# Patient Record
Sex: Male | Born: 1945
Health system: Southern US, Community
[De-identification: ages and names within clinical notes are randomized; demographics above are authoritative.]

## PROBLEM LIST (undated history)

## (undated) DIAGNOSIS — C801 Malignant (primary) neoplasm, unspecified: Secondary | ICD-10-CM

## (undated) DIAGNOSIS — E785 Hyperlipidemia, unspecified: Secondary | ICD-10-CM

## (undated) HISTORY — DX: Malignant (primary) neoplasm, unspecified: C80.1

## (undated) HISTORY — DX: Hyperlipidemia, unspecified: E78.5

---

## 2009-07-28 ENCOUNTER — Encounter: Admission: RE | Admit: 2009-07-28 | Discharge: 2009-07-28 | Payer: Self-pay | Admitting: Family Medicine

## 2010-04-28 ENCOUNTER — Ambulatory Visit: Admission: RE | Admit: 2010-04-28 | Discharge: 2010-07-27 | Payer: Self-pay | Admitting: Radiation Oncology

## 2010-05-25 ENCOUNTER — Encounter: Admission: RE | Admit: 2010-05-25 | Discharge: 2010-05-25 | Payer: Self-pay | Admitting: Urology

## 2010-07-13 ENCOUNTER — Ambulatory Visit (HOSPITAL_BASED_OUTPATIENT_CLINIC_OR_DEPARTMENT_OTHER): Admission: RE | Admit: 2010-07-13 | Discharge: 2010-07-13 | Payer: Self-pay | Admitting: Urology

## 2010-08-02 ENCOUNTER — Ambulatory Visit
Admission: RE | Admit: 2010-08-02 | Discharge: 2010-09-05 | Payer: Self-pay | Source: Home / Self Care | Admitting: Radiation Oncology

## 2010-12-21 LAB — CBC
HCT: 44.3 % (ref 39.0–52.0)
Hemoglobin: 15.2 g/dL (ref 13.0–17.0)
MCH: 31.6 pg (ref 26.0–34.0)
MCHC: 34.3 g/dL (ref 30.0–36.0)
MCV: 92 fL (ref 78.0–100.0)
Platelets: 160 10*3/uL (ref 150–400)
RBC: 4.82 MIL/uL (ref 4.22–5.81)
RDW: 12.9 % (ref 11.5–15.5)
WBC: 5.4 10*3/uL (ref 4.0–10.5)

## 2010-12-21 LAB — COMPREHENSIVE METABOLIC PANEL
ALT: 30 U/L (ref 0–53)
AST: 35 U/L (ref 0–37)
Albumin: 4.3 g/dL (ref 3.5–5.2)
Alkaline Phosphatase: 73 U/L (ref 39–117)
BUN: 20 mg/dL (ref 6–23)
CO2: 31 mEq/L (ref 19–32)
Calcium: 9.4 mg/dL (ref 8.4–10.5)
Chloride: 102 mEq/L (ref 96–112)
Creatinine, Ser: 1.33 mg/dL (ref 0.4–1.5)
GFR calc Af Amer: >60 mL/min (ref >60)
GFR calc non Af Amer: 54 mL/min — ABNORMAL LOW (ref >60)
Glucose, Bld: 104 mg/dL — ABNORMAL HIGH (ref 70–99)
Potassium: 4.8 mEq/L (ref 3.5–5.1)
Sodium: 139 mEq/L (ref 135–145)
Total Bilirubin: 1 mg/dL (ref 0.3–1.2)
Total Protein: 7.1 g/dL (ref 6.0–8.3)

## 2010-12-21 LAB — PROTIME-INR
INR: 1 (ref 0.00–1.49)
Prothrombin Time: 13.4 seconds (ref 11.6–15.2)

## 2010-12-21 LAB — APTT: aPTT: 32 seconds (ref 24–37)

## 2011-02-05 ENCOUNTER — Ambulatory Visit: Payer: Self-pay | Attending: Radiation Oncology | Admitting: Radiation Oncology

## 2011-09-04 ENCOUNTER — Other Ambulatory Visit: Payer: Self-pay | Admitting: Urology

## 2011-09-04 DIAGNOSIS — C61 Malignant neoplasm of prostate: Secondary | ICD-10-CM

## 2011-09-12 ENCOUNTER — Encounter (HOSPITAL_COMMUNITY)
Admission: RE | Admit: 2011-09-12 | Discharge: 2011-09-12 | Disposition: A | Discharge status: Home / Self Care | Payer: BC Managed Care – PPO | Source: Ambulatory Visit | Attending: Urology | Admitting: Urology

## 2011-09-12 DIAGNOSIS — C61 Malignant neoplasm of prostate: Secondary | ICD-10-CM | POA: Insufficient documentation

## 2011-09-12 MED ORDER — TECHNETIUM TC 99M MEDRONATE IV KIT
25.0000 | PACK | Freq: Once | INTRAVENOUS | Status: AC | PRN
  Administered 2011-09-12: 25 via INTRAVENOUS

## 2011-10-09 DIAGNOSIS — C801 Malignant (primary) neoplasm, unspecified: Secondary | ICD-10-CM

## 2011-10-09 HISTORY — DX: Malignant (primary) neoplasm, unspecified: C80.1

## 2011-11-15 ENCOUNTER — Encounter: Payer: Self-pay | Admitting: *Deleted

## 2011-11-15 NOTE — Progress Notes (Signed)
05/01/10,    I-PSS= [id number] score=9

## 2015-01-20 ENCOUNTER — Other Ambulatory Visit: Payer: Self-pay | Admitting: Family Medicine

## 2015-01-20 ENCOUNTER — Ambulatory Visit
Admission: RE | Admit: 2015-01-20 | Discharge: 2015-01-20 | Disposition: A | Discharge status: Home / Self Care | Payer: BLUE CROSS/BLUE SHIELD | Source: Ambulatory Visit | Attending: Family Medicine | Admitting: Family Medicine

## 2015-01-20 DIAGNOSIS — M5416 Radiculopathy, lumbar region: Secondary | ICD-10-CM

## 2015-02-08 ENCOUNTER — Ambulatory Visit: Payer: BLUE CROSS/BLUE SHIELD | Attending: Family Medicine

## 2015-02-08 DIAGNOSIS — M545 Low back pain: Secondary | ICD-10-CM | POA: Diagnosis present

## 2015-02-08 DIAGNOSIS — M25659 Stiffness of unspecified hip, not elsewhere classified: Secondary | ICD-10-CM | POA: Insufficient documentation

## 2015-02-08 DIAGNOSIS — M5442 Lumbago with sciatica, left side: Secondary | ICD-10-CM

## 2015-02-08 DIAGNOSIS — M5410 Radiculopathy, site unspecified: Secondary | ICD-10-CM | POA: Insufficient documentation

## 2015-02-08 NOTE — Patient Instructions (Signed)
On Elbows (Prone)  Rise up on elbows as high as possible, keeping hips on floor. Hold 1-5 minutes  Do _3-5___ sessions per day.  Press-Up  Press upper body upward, keeping hips in contact with floor. Keep lower back and buttocks relaxed. Hold _3-5___ seconds. Repeat ____ times per set. Do ____ sessions per day.                     Backward Bend (Standing)   Arch backward to make hollow of back deeper. Hold _5___ seconds. Repeat __10__ times per set. Do ____ sessions per day. Copyright  V[employer]. All rights reserved.    If you have back pain and the pain travels to your hips and/or legs, please STOP the exercises  If you already have leg pain and the leg pain gets worse, STOP and let your therapist know at your next visit  Occasionally, when you abolish leg pain, you may have a temporary increase in low back pain.  This can be a normal reaction and the back pain should eventually get better.  However, if the pain is too significant to exercise, please STOP the exercises and let your therapist know at your next visit.

## 2015-02-08 NOTE — Therapy (Signed)
[facility] [address] [place], [place], [address] Phone: [phone]   Fax:  [phone]  Physical Therapy Evaluation  Patient Details  Name: Jacob Golden MRN: [id number] Date of Birth: 17-Jun-1946 Referring Provider:  Jonathon Jordan, MD  Encounter Date: 02/08/2015      PT End of Session - 02/08/15 1311    Visit Number 1   Date for PT Re-Evaluation 04/05/15   PT Start Time 5885   PT Stop Time 1311   PT Time Calculation (min) 40 min   Activity Tolerance Patient tolerated treatment well   Behavior During Therapy [facility] for tasks assessed/performed      Past Medical History  Diagnosis Date  . Cancer 2013    prostate cancer  . Hyperlipidemia     History reviewed. No pertinent past surgical history.  There were no vitals filed for this visit.  Visit Diagnosis:  Left-sided low back pain with left-sided sciatica - Plan: PT plan of care cert/re-cert  Hip stiffness, unspecified laterality - Plan: PT plan of care cert/re-cert      Subjective Assessment - 02/08/15 1239    Subjective Pt reports to PT with complaints of Lt LE pain that began 6 weeks ago when he bent down to pick up something in the garden.     Pertinent History prostate cancer   Limitations Walking   How long can you sit comfortably? pain after sitting   How long can you walk comfortably? pt needs to walk very slowly to allow for 1 hour of walking   Diagnostic tests x-ray: degenerative at L3,4,5 and OA in Lt hip.   Patient Stated Goals reduce pain, walk at normal speed, return to exercise   Currently in Pain? Yes   Pain Score 7   2-8/10 range   Pain Location Leg   Pain Orientation Left   Pain Descriptors / Indicators Numbness;Sharp;Dull   Pain Type Chronic pain   Pain Frequency Constant   Aggravating Factors  turning quickly, stepping up, standing up after sitting long periods, walking   Pain Relieving Factors slow down when walking   Multiple Pain Sites No            [facility] PT Assessment - 02/08/15 0001    Assessment   Medical Diagnosis Low back pain (M54.5), radiculopathy (M54.10)   Onset Date 01/02/15   Next MD Visit none   Prior Therapy none   Precautions   Precautions Other (comment)  No [place]- history of cancer   Restrictions   Weight Bearing Restrictions No   Balance Screen   Has the patient fallen in the past 6 months No   Has the patient had a decrease in activity level because of a fear of falling?  No   Is the patient reluctant to leave their home because of a fear of falling?  No   Home Environment   Living Enviornment Private residence   Home Access Level entry   [place] One level   Prior Function   Level of Independence Independent with basic ADLs   Vocation Full time employment   Vocation Requirements computer work   Leisure biking, running,   Cognition   Overall Cognitive Status Within Functional Limits for tasks assessed   Observation/Other Assessments   Focus on Therapeutic Outcomes (FOTO)  51% limitation   Posture/Postural Control   Posture/Postural Control Postural limitations   Postural Limitations Weight shift right   ROM / Strength   AROM / PROM /  Strength AROM;PROM;Strength   AROM   Overall AROM  Deficits   Overall AROM Comments Lumbar AROM limited by 25% in bilateral sidebending, full flexion with Lt LE pain, extension limited by 50% with relief of Lt LE symptoms.   PROM   Overall PROM  Deficits   Overall PROM Comments Rt hip flexibility is limited by 50% in all directions, Lt hip flexibility limited by 30-40% in all directions.     Strength   Overall Strength Within functional limits for tasks performed   Overall Strength Comments 4+/5 to 5/5 bilateral LE strength thrgoughout   Palpation   Palpation Pt with significant limitaiton in Rt and Lt hip joint mobility.  Reduced PA mobility in the lumbar and thoracic spine.  No significant palpable tenderness in the lumbar and  thoracic paraspinals.     Special Tests    Special Tests Lumbar   Slump test   Findings Positive   Side Left   Straight Leg Raise   Findings Positive   Side  Left   Ambulation/Gait   Ambulation/Gait Yes   Ambulation/Gait Assistance 7: Independent   Gait velocity reduced gait velocity                           PT Education - 02/08/15 1303    Education provided Yes   Education Details HEP: Nature conservation officer) Educated Patient   Methods Explanation;Handout;Demonstration   Comprehension Verbalized understanding          PT Short Term Goals - 02/08/15 1314    PT SHORT TERM GOAL #1   Title be independent in intial HEP   Time 4   Period Weeks   Status New   PT SHORT TERM GOAL #2   Title report a 25% reduction in Lt LE radiculopathy    Time 4   Period Weeks   Status New   PT SHORT TERM GOAL #3   Title sit with Rt=Lt weightbearing at least 50% of the time when sitting at work   Time 4   Period Weeks   Status New           PT Long Term Goals - 02/08/15 1235    PT [place] #1   Title be independent in advanced HEP   Time 8   Period Weeks   Status New   PT LONG TERM GOAL #2   Title reduce FOTO to < or 35% limitation   Time 8   Period Weeks   Status New   PT LONG TERM GOAL #3   Title report a 50% reduction in Lt LE radiculopathy   Time 8   Period Weeks   Status New   PT LONG TERM GOAL #4   Title return to cycling for exercise without limitation   Time 8   Period Weeks   Status New   PT LONG TERM GOAL #5   Title sit with Rt=Lt weightbearing at least 75% of the time at work   Time [place] - 02/08/15 1311    Clinical Impression Statement Pt presents to PT with Lt LE radiculopathy that began ~6 weeks ago when he bent over in his garden.  Pt presents with positive SLR and slump test on the Lt and significant hip flexibility limitation.  Pt will benefit from PT  to improve  body mechanics, flexibility and traction to reduce radiculopathy.    Pt will benefit from skilled therapeutic intervention in order to improve on the following deficits Improper body mechanics;Impaired flexibility;Decreased activity tolerance;Pain   Rehab Potential Good   PT Frequency 2x / week   PT Duration 8 weeks   PT Treatment/Interventions ADLs/Self Care Home Management;Moist Heat;Therapeutic activities;Patient/family education;Therapeutic exercise;Traction;Passive range of motion;Manual techniques;Cryotherapy;Neuromuscular re-education;Electrical Stimulation   PT Next Visit Plan Lumbar traction, body mechanics education, hip/lumbar mobs, modalities PRN   Consulted and Agree with Plan of Care Patient         Problem List There are no active problems to display for this patient.   TAKACS,KELLY, PT 02/08/2015, 1:19 PM  [facility] [facility] [address] [place], [place], [address] Phone: [phone]   Fax:  [phone]

## 2015-02-09 ENCOUNTER — Ambulatory Visit: Payer: BLUE CROSS/BLUE SHIELD

## 2015-02-15 ENCOUNTER — Ambulatory Visit: Payer: BLUE CROSS/BLUE SHIELD | Admitting: Physical Therapy

## 2015-02-15 ENCOUNTER — Encounter: Payer: Self-pay | Admitting: Physical Therapy

## 2015-02-15 DIAGNOSIS — M25659 Stiffness of unspecified hip, not elsewhere classified: Secondary | ICD-10-CM

## 2015-02-15 DIAGNOSIS — M5442 Lumbago with sciatica, left side: Secondary | ICD-10-CM

## 2015-02-15 DIAGNOSIS — M545 Low back pain: Secondary | ICD-10-CM | POA: Diagnosis not present

## 2015-02-15 NOTE — Therapy (Signed)
[facility] [address], [place], [place], [address] Phone: [phone]   Fax:  [phone]  Physical Therapy Treatment  Patient Details  Name: Jacob Golden MRN: [id number] Date of Birth: 1946-09-18 Referring Provider:  Jonathon Jordan, MD  Encounter Date: 02/15/2015      PT End of Session - 02/15/15 1656    Visit Number 2   Date for PT Re-Evaluation 04/05/15   PT Start Time 3094   PT Stop Time 1702   PT Time Calculation (min) 46 min   Activity Tolerance Patient tolerated treatment well   Behavior During Therapy [facility] for tasks assessed/performed      Past Medical History  Diagnosis Date  . Cancer 2013    prostate cancer  . Hyperlipidemia     History reviewed. No pertinent past surgical history.  There were no vitals filed for this visit.  Visit Diagnosis:  Left-sided low back pain with left-sided sciatica  Hip stiffness, unspecified laterality                       [facility] Adult PT Treatment/Exercise - 02/15/15 0001    Exercises   Exercises Lumbar   Lumbar Exercises: Stretches   Standing Extension Other (comment)  McKenzie extension 2 x 10 reps    Prone on Elbows Stretch 1 rep  2 minutes   Press Ups Other (comment)  McKenzie extension in prone x 10 pt limited in Rom   Lumbar Exercises: Aerobic   Stationary Bike L2 x 23min   Modalities   Modalities Traction   Traction   Type of Traction Lumbar   Min (lbs) 55   Max (lbs) 75   Hold Time 60   Rest Time 10   Time 15                PT Education - 02/15/15 1628    Education provided Yes   Education Details Bodymechanics: lifting squat down, getting in and out of bed, golferlift, sitting with towel roll in back    Person(s) Educated Patient   Methods Explanation;Demonstration;Handout   Comprehension Verbalized understanding;Returned demonstration          PT Short Term Goals - 02/15/15 1700    PT SHORT TERM GOAL #1    Title be independent in intial HEP   Time 4   Period Weeks   Status On-going   PT SHORT TERM GOAL #2   Title report a 25% reduction in Lt LE radiculopathy    Time 4   Period Weeks   Status On-going   PT SHORT TERM GOAL #3   Title sit with Rt=Lt weightbearing at least 50% of the time when sitting at work   Time 4   Period Weeks   Status On-going           PT Long Term Goals - 02/15/15 1701    PT LONG TERM GOAL #1   Title be independent in advanced HEP   Time 8   Period Weeks   Status On-going   PT LONG TERM GOAL #2   Title reduce FOTO to < or 35% limitation   Time 8   Period Weeks   Status On-going   PT LONG TERM GOAL #3   Title report a 50% reduction in Lt LE radiculopathy   Time 8   Period Weeks   Status On-going   PT LONG TERM GOAL #4   Title return to cycling for exercise  without limitation   Time 8   Period Weeks   Status On-going   PT LONG TERM GOAL #5   Title sit with Rt=Lt weightbearing at least 75% of the time at work   Time 8   Period Weeks   Status On-going               Plan - 02/15/15 1656    Clinical Impression Statement Pt reports Lt LE radiculopathy intermittend in nature started 6 weeks ago when he bent over in the garden. Pt tested positive with SLR and slump test on the Lt and significant hip flexibility limitation.    Pt will benefit from skilled therapeutic intervention in order to improve on the following deficits Improper body mechanics;Impaired flexibility;Decreased activity tolerance;Pain   Rehab Potential Good   PT Frequency 2x / week   PT Duration 8 weeks   PT Next Visit Plan Lumbar traction, body mechanics education, hip/lumbar mobs, modalities PRN   Consulted and Agree with Plan of Care Patient        Problem List There are no active problems to display for this patient.   NAUMANN-HOUEGNIFIO,Vincie Linn PTA 02/15/2015, 5:03 PM   [facility] [address] [place], [place], [address] Phone: [phone]   Fax:  [phone]

## 2015-02-15 NOTE — Patient Instructions (Signed)
   Lifting Principles  .Maintain proper posture and head alignment. .Slide object as close as possible before lifting. .Move obstacles out of the way. .Test before lifting; ask for help if too heavy. .Tighten stomach muscles without holding breath. .Use smooth movements; do not jerk. .Use legs to do the work, and pivot with feet. .Distribute the work load symmetrically and close to the center of trunk. .Push instead of pull whenever possible.   Squat down and hold basket close to stand. Use leg muscles to do the work.    Avoid twisting or bending back. Pivot around using foot movements, and bend at knees if needed when reaching for articles.        Getting Into / Out of Bed   Lower self to lie down on one side by raising legs and lowering head at the same time. Use arms to assist moving without twisting. Bend both knees to roll onto back if desired. To sit up, start from lying on side, and use same move-ments in reverse. Keep trunk aligned with legs.    Shift weight from front foot to back foot as item is lifted off shelf.    When leaning forward to pick object up from floor, extend one leg out behind. Keep back straight. Hold onto a sturdy support with other hand.      Sit upright, head facing forward. Try using a roll to support lower back. Keep shoulders relaxed, and avoid rounded back. Keep hips level with knees. Avoid crossing legs for long periods.     

## 2015-02-16 ENCOUNTER — Ambulatory Visit: Payer: BLUE CROSS/BLUE SHIELD | Admitting: Physical Therapy

## 2015-02-23 ENCOUNTER — Encounter: Payer: Self-pay | Admitting: Physical Therapy

## 2015-02-23 ENCOUNTER — Ambulatory Visit: Payer: BLUE CROSS/BLUE SHIELD | Admitting: Physical Therapy

## 2015-02-23 DIAGNOSIS — M545 Low back pain: Secondary | ICD-10-CM | POA: Diagnosis not present

## 2015-02-23 DIAGNOSIS — M25659 Stiffness of unspecified hip, not elsewhere classified: Secondary | ICD-10-CM

## 2015-02-23 DIAGNOSIS — M5442 Lumbago with sciatica, left side: Secondary | ICD-10-CM

## 2015-02-23 NOTE — Therapy (Signed)
[facility] [address], [place], [place], [address] Phone: [phone]   Fax:  [phone]  Physical Therapy Treatment  Patient Details  Name: Jacob Golden MRN: [id number] Date of Birth: November 21, 1945 Referring Provider:  Jonathon Jordan, MD  Encounter Date: 02/23/2015      PT End of Session - 02/23/15 0905    Visit Number 3   Date for PT Re-Evaluation 04/05/15   PT Start Time 0846   PT Stop Time 0935   PT Time Calculation (min) 49 min   Behavior During Therapy [facility] for tasks assessed/performed      Past Medical History  Diagnosis Date  . Cancer 2013    prostate cancer  . Hyperlipidemia     History reviewed. No pertinent past surgical history.  There were no vitals filed for this visit.  Visit Diagnosis:  Left-sided low back pain with left-sided sciatica  Hip stiffness, unspecified laterality      Subjective Assessment - 02/23/15 0855    Subjective I take things easy then it is good, but with bending or walking fast pain in low back and left LE   Limitations Walking   Currently in Pain? No/denies  Pt reports short sharp pain with bending or transition sit to stand   Pain Score 3    Pain Location Leg   Pain Orientation Left   Pain Descriptors / Indicators Sharp   Pain Type Chronic pain   Pain Frequency Constant   Multiple Pain Sites No                         [facility] Adult PT Treatment/Exercise - 02/23/15 0001    Lumbar Exercises: Stretches   Standing Extension Other (comment)  McKenzie extension, ant knees braced agains mat table    Prone on Elbows Stretch 1 rep  x85min   Press Ups Other (comment)  pt with improved ROM today   Lumbar Exercises: Aerobic   Stationary Bike L2 x 7 min   Lumbar Exercises: Prone   Other Prone Lumbar Exercises B UE at side lifting up chest and bil UE  x10 with 5 sec hold, challenging for pt.   Modalities   Modalities Traction   Traction   Min  (lbs) 55   Max (lbs) 75   Hold Time 60   Rest Time 10   Time 15                PT Education - 02/23/15 0926    Education provided Yes   Education Details strengthening in prone   Person(s) Educated Patient   Methods Explanation;Demonstration;Handout   Comprehension Verbalized understanding;Returned demonstration          PT Short Term Goals - 02/23/15 0912    PT SHORT TERM GOAL #1   Title be independent in intial HEP   Time 4   Period Weeks   Status On-going   PT SHORT TERM GOAL #2   Title report a 25% reduction in Lt LE radiculopathy   reports 15% improvement   Time 4   Period Weeks   Status On-going   PT SHORT TERM GOAL #3   Title sit with Rt=Lt weightbearing at least 50% of the time when sitting at work   Time 4   Period Weeks   Status On-going           PT Long Term Goals - 02/23/15 0912    PT LONG TERM GOAL #  1   Title be independent in advanced HEP   Time 8   Period Weeks   Status On-going   PT LONG TERM GOAL #2   Title reduce FOTO to < or 35% limitation   Time 8   Period Weeks   Status On-going   PT LONG TERM GOAL #3   Title report a 50% reduction in Lt LE radiculopathy  as of 02/23/15 reports 15% improvement   Time 8   Period Weeks   Status On-going   PT LONG TERM GOAL #4   Title return to cycling for exercise without limitation   Time 8   Period Weeks   Status On-going   PT LONG TERM GOAL #5   Title sit with Rt=Lt weightbearing at least 75% of the time at work   Time 8   Period Weeks   Status On-going               Plan - 02/23/15 0908    Clinical Impression Statement Pt appears with improved ROM lumbar spine, able to tolerate activities well in PT, he will continue to benefit from PT   Pt will benefit from skilled therapeutic intervention in order to improve on the following deficits Improper body mechanics;Impaired flexibility;Decreased activity tolerance;Pain   Rehab Potential Good   PT Frequency 2x / week   PT  Duration 8 weeks   PT Treatment/Interventions ADLs/Self Care Home Management;Moist Heat;Therapeutic activities;Patient/family education;Therapeutic exercise;Traction;Passive range of motion;Manual techniques;Cryotherapy;Neuromuscular re-education;Electrical Stimulation   PT Next Visit Plan Continue with traction, back strength, may bodybladde, modalities PRN   Consulted and Agree with Plan of Care Patient        Problem List There are no active problems to display for this patient.   NAUMANN-HOUEGNIFIO,Dravon Nott PTA 02/23/2015, 9:30 AM  [place] [facility] [address] [place], [place], [address] Phone: [phone]   Fax:  [phone]

## 2015-02-23 NOTE — Patient Instructions (Signed)
back

## 2015-02-25 ENCOUNTER — Ambulatory Visit: Payer: BLUE CROSS/BLUE SHIELD | Admitting: Physical Therapy

## 2015-02-25 ENCOUNTER — Encounter: Payer: Self-pay | Admitting: Physical Therapy

## 2015-02-25 DIAGNOSIS — M545 Low back pain: Secondary | ICD-10-CM | POA: Diagnosis not present

## 2015-02-25 DIAGNOSIS — M25659 Stiffness of unspecified hip, not elsewhere classified: Secondary | ICD-10-CM

## 2015-02-25 DIAGNOSIS — M5442 Lumbago with sciatica, left side: Secondary | ICD-10-CM

## 2015-02-25 NOTE — Therapy (Signed)
[facility] [address] [place], [place], [address] Phone: [phone]   Fax:  [phone]  Physical Therapy Treatment  Patient Details  Name: Jacob Golden MRN: [id number] Date of Birth: 11/18/1945 Referring Provider:  Jonathon Jordan, MD  Encounter Date: 02/25/2015      PT End of Session - 02/25/15 1012    Visit Number 4   Date for PT Re-Evaluation 04/05/15   PT Start Time 0932   PT Stop Time 1020   PT Time Calculation (min) 48 min   Activity Tolerance Patient tolerated treatment well   Behavior During Therapy [facility] for tasks assessed/performed      Past Medical History  Diagnosis Date  . Cancer 2013    prostate cancer  . Hyperlipidemia     History reviewed. No pertinent past surgical history.  There were no vitals filed for this visit.  Visit Diagnosis:  Left-sided low back pain with left-sided sciatica  Hip stiffness, unspecified laterality      Subjective Assessment - 02/25/15 0949    Subjective I take things easy then it is good, but with bending or walking fast pain in low back and left LE   Currently in Pain? No/denies   Multiple Pain Sites No                         [facility] Adult PT Treatment/Exercise - 02/25/15 0001    Lumbar Exercises: Stretches   Standing Extension Other (comment)  McKenzie extension, bil. ant knees braced on mat table   Prone on Elbows Stretch 1 rep  2 min   Press Ups Other (comment)  2 x 10 after Manual therapy improved ROM   Lumbar Exercises: Aerobic   Stationary Bike L2 x 8 min   Traction   Type of Traction Lumbar   Min (lbs) 55   Max (lbs) 80   Hold Time 60   Rest Time 10   Time 15   Manual Therapy   Manual Therapy Joint mobilization;Soft tissue mobilization  left hip into extension, with quadriceps stretch   Soft tissue mobilization STW with quadricceps stretch x 5 Lt LE, STW to Lt qluteal area                  PT Short Term  Goals - 02/23/15 0912    PT SHORT TERM GOAL #1   Title be independent in intial HEP   Time 4   Period Weeks   Status On-going   PT SHORT TERM GOAL #2   Title report a 25% reduction in Lt LE radiculopathy   reports 15% improvement   Time 4   Period Weeks   Status On-going   PT SHORT TERM GOAL #3   Title sit with Rt=Lt weightbearing at least 50% of the time when sitting at work   Time 4   Period Weeks   Status On-going           PT Long Term Goals - 02/23/15 0912    PT LONG TERM GOAL #1   Title be independent in advanced HEP   Time 8   Period Weeks   Status On-going   PT LONG TERM GOAL #2   Title reduce FOTO to < or 35% limitation   Time 8   Period Weeks   Status On-going   PT LONG TERM GOAL #3   Title report a 50% reduction in Lt LE radiculopathy  as of 02/23/15  reports 15% improvement   Time 8   Period Weeks   Status On-going   PT LONG TERM GOAL #4   Title return to cycling for exercise without limitation   Time 8   Period Weeks   Status On-going   PT LONG TERM GOAL #5   Title sit with Rt=Lt weightbearing at least 75% of the time at work   Time 8   Period Weeks   Status On-going               Plan - 02/25/15 1013    Clinical Impression Statement Pt will continue from skilled PT to help return to normal activities and able to walk/ run fast   Pt will benefit from skilled therapeutic intervention in order to improve on the following deficits Improper body mechanics;Impaired flexibility;Decreased activity tolerance;Pain   Rehab Potential Good   PT Frequency 2x / week   PT Duration 8 weeks   PT Next Visit Plan Continue with traction, back strength, may bodybladde, modalities PRN   Consulted and Agree with Plan of Care Patient        Problem List There are no active problems to display for this patient.   NAUMANN-HOUEGNIFIO,Navayah Sok PTA 02/25/2015, 10:15 AM  [facility] [facility] [address], [place], [place], [address] Phone: [phone]   Fax:  [phone]

## 2015-03-02 ENCOUNTER — Encounter: Payer: Self-pay | Admitting: Physical Therapy

## 2015-03-02 ENCOUNTER — Ambulatory Visit: Payer: BLUE CROSS/BLUE SHIELD | Admitting: Physical Therapy

## 2015-03-02 DIAGNOSIS — M545 Low back pain: Secondary | ICD-10-CM | POA: Diagnosis not present

## 2015-03-02 DIAGNOSIS — M5442 Lumbago with sciatica, left side: Secondary | ICD-10-CM

## 2015-03-02 DIAGNOSIS — M25659 Stiffness of unspecified hip, not elsewhere classified: Secondary | ICD-10-CM

## 2015-03-02 NOTE — Therapy (Signed)
[facility] [address] [place], [place], [address] Phone: [phone]   Fax:  [phone]  Physical Therapy Treatment  Patient Details  Name: Jacob Golden MRN: [id number] Date of Birth: [birth year] Referring Provider:  Jonathon Jordan, MD  Encounter Date: 03/02/2015      PT End of Session - 03/02/15 0851    Visit Number 5   Date for PT Re-Evaluation 04/05/15   PT Start Time 0844   PT Stop Time 0940   PT Time Calculation (min) 56 min   Activity Tolerance Patient tolerated treatment well   Behavior During Therapy [facility] for tasks assessed/performed      Past Medical History  Diagnosis Date  . Cancer 2013    prostate cancer  . Hyperlipidemia     History reviewed. No pertinent past surgical history.  There were no vitals filed for this visit.  Visit Diagnosis:  Left-sided low back pain with left-sided sciatica  Hip stiffness, unspecified laterality      Subjective Assessment - 03/02/15 0848    Subjective Walking fast or bending causes increase of back pain, taking things easy to prevent    Currently in Pain? Yes   Pain Score 1   with bending pain up to 3-4/10, short duration   Pain Location Leg   Pain Orientation Left   Pain Descriptors / Indicators Sharp   Pain Type Chronic pain   Pain Frequency Constant   Multiple Pain Sites No                         [facility] Adult PT Treatment/Exercise - 03/02/15 0001    Lumbar Exercises: Stretches   Standing Extension Other (comment)  McKenzie extension, pt advised to     Prone on Elbows Stretch 1 rep  57min   Press Ups Other (comment)  2x10   Piriformis Stretch 20 seconds;3 reps  each side using towel   Piriformis Stretch Limitations left side more    Lumbar Exercises: Aerobic   Stationary Bike L2 x 8 min   Lumbar Exercises: Supine   Other Supine Lumbar Exercises --   Modalities   Modalities Electrical Stimulation;Moist Heat   Moist  Heat Therapy   Number Minutes Moist Heat 15 Minutes   Moist Heat Location Lumbar Spine   Electrical Stimulation   Electrical Stimulation Location Lumbar   Electrical Stimulation Action IFC   Electrical Stimulation Parameters 80-50Hz    Electrical Stimulation Goals Pain   Manual Therapy   Manual Therapy Joint mobilization;Soft tissue mobilization  left hip into extension and ER/IR   Soft tissue mobilization STW with quadricceps stretch x 5 Lt LE, STW to Lt qluteal area                PT Education - 03/02/15 1310    Education provided Yes   Education Details Figure 4 in supine using towel   Person(s) Educated Patient   Methods Explanation;Demonstration;Handout   Comprehension Verbalized understanding;Returned demonstration          PT Short Term Goals - 03/02/15 0858    PT SHORT TERM GOAL #1   Title be independent in intial HEP   Time 4   Period Weeks   Status Achieved   PT SHORT TERM GOAL #2   Title report a 25% reduction in Lt LE radiculopathy   25 -30% improved   Time 4   Period Weeks   Status Achieved   PT SHORT TERM GOAL #  3   Title sit with Rt=Lt weightbearing at least 50% of the time when sitting at work   Time 4   Period Weeks   Status On-going           PT Long Term Goals - 03/02/15 1303    PT LONG TERM GOAL #1   Title be independent in advanced HEP   Time 8   Period Weeks   Status On-going   PT LONG TERM GOAL #2   Title reduce FOTO to < or 35% limitation   Time 8   Period Weeks   Status On-going   PT LONG TERM GOAL #3   Title report a 50% reduction in Lt LE radiculopathy   Time 8   Period Weeks   Status On-going   PT LONG TERM GOAL #4   Title return to cycling for exercise without limitation   Time 8   Period Weeks   Status On-going   PT LONG TERM GOAL #5   Title sit with Rt=Lt weightbearing at least 75% of the time at work   Time 8   Period Weeks   Status On-going               Plan - 03/02/15 0851    Clinical  Impression Statement Pt will continue to improve from skilled PT to return to normal activities and able to walk fast   Pt will benefit from skilled therapeutic intervention in order to improve on the following deficits Improper body mechanics;Impaired flexibility;Decreased activity tolerance;Pain   Rehab Potential Good   PT Frequency 2x / week   PT Duration 8 weeks   PT Treatment/Interventions ADLs/Self Care Home Management;Moist Heat;Therapeutic activities;Patient/family education;Therapeutic exercise;Traction;Passive range of motion;Manual techniques;Cryotherapy;Neuromuscular re-education;Electrical Stimulation   PT Next Visit Plan Add figure 4 stretch in sitting pt has pring out,  Continue with e-stime and heat if beneficial, back strength, hip and lumbar flexibility   Consulted and Agree with Plan of Care Patient        Problem List There are no active problems to display for this patient.   NAUMANN-HOUEGNIFIO,Jacob Golden PTA 03/02/2015, 1:12 PM  [facility] [address] [place], [place], [address] Phone: [phone]   Fax:  [phone]

## 2015-03-02 NOTE — Patient Instructions (Signed)
Piriformis (Supine)  Cross legs, right on top. Gently pull other knee toward chest until stretch is felt in buttock/hip of top leg. Hold 20  seconds. Repeat 3  times per set each leg. Do 3  sets per session. Do 2-3  sessions per day.  Hip Stretch  Put right ankle over left knee. Let right knee fall downward, but keep ankle in place. Feel the stretch in hip. May push down gently with hand to feel stretch. Hold 20 seconds while counting out loud. Repeat with other leg. Repeat 3 times. Do 2-3 sessions per day.  Stretching: Piriformis   Cross left leg over other thigh and place elbow over outside of knee. Gently stretch buttock muscles by pushing bent knee across body. Hold ____ seconds. Repeat 3 times per set. Do  3 sets per session. Do 2-3 sessions per day.  Stretching: Piriformis (Supine)  Pull right knee toward opposite shoulder. Hold 20 seconds. Relax. Repeat 3 times per set. Do 1 sets per session. Do 2 -3 sessions per day.  Piriformis Stretch, Kneeling Modified [place]  From hands and knees, slide one leg backward, turn bent other leg out slightly to side. Rest weight on outside of bent leg. If extended hip is elevated place a blanket or towel underneath to relax hip. With back straight, lay trunk forward over bent leg. Hold 20 seconds or do the variation of long holds up to 87min.  Repeat 3 times per session with 20 sec hold or one with long hold. Do  2-3 sessions per day.  Copyright  VHI. All rights reserved.

## 2015-03-04 ENCOUNTER — Encounter: Payer: BLUE CROSS/BLUE SHIELD | Admitting: Physical Therapy

## 2015-03-09 ENCOUNTER — Ambulatory Visit: Payer: BLUE CROSS/BLUE SHIELD | Attending: Family Medicine

## 2015-03-09 DIAGNOSIS — M5442 Lumbago with sciatica, left side: Secondary | ICD-10-CM | POA: Insufficient documentation

## 2015-03-09 DIAGNOSIS — M25659 Stiffness of unspecified hip, not elsewhere classified: Secondary | ICD-10-CM | POA: Insufficient documentation

## 2015-03-09 NOTE — Therapy (Signed)
[facility] [address] [place], [place], [address] Phone: [phone]   Fax:  [phone]  Physical Therapy Treatment  Patient Details  Name: Jacob Golden MRN: [id number] Date of Birth: [birth year] Referring Provider:  Jonathon Jordan, MD  Encounter Date: 03/09/2015      PT End of Session - 03/09/15 0846    Visit Number 6   Date for PT Re-Evaluation 04/05/15   PT Start Time 0843   PT Stop Time 0928   PT Time Calculation (min) 45 min   Activity Tolerance Patient tolerated treatment well   Behavior During Therapy [facility] for tasks assessed/performed      Past Medical History  Diagnosis Date  . Cancer 2013    prostate cancer  . Hyperlipidemia     History reviewed. No pertinent past surgical history.  There were no vitals filed for this visit.  Visit Diagnosis:  Left-sided low back pain with left-sided sciatica  Hip stiffness, unspecified laterality      Subjective Assessment - 03/09/15 0849    Subjective Feeling okay, not much different since the last time he was here. Has not been doing exercises over the weekend. Did yard work during the weekend. Still has discomfort in buttocks, but does not have shooting pain down the leg.   How long can you walk comfortably? Able to walk/garden for a couple of hours; has to be careful to not over-exert himself.   Currently in Pain? No/denies                         [facility] Adult PT Treatment/Exercise - 03/09/15 0001    Exercises   Exercises Shoulder   Lumbar Exercises: Stretches   Piriformis Stretch 20 seconds   Piriformis Stretch Limitations Review for HEP    Lumbar Exercises: Aerobic   Stationary Bike L2 x 8 min   Lumbar Exercises: Machines for Strengthening   Other Lumbar Machine Exercise Backwards walking; 20# with two handholds at machine, 1x10   Lumbar Exercises: Quadruped   Opposite Arm/Leg Raise Right arm/Left leg;Left arm/Right leg;10  reps;3 seconds  Pillow under stomach    Shoulder Exercises: ROM/Strengthening   UBE (Upper Arm Bike) 3 min forward, 3 min backward on green ball   Manual Therapy   Manual Therapy Joint mobilization   Manual therapy comments PA mobs to L1-5                  PT Short Term Goals - 03/09/15 8242    PT SHORT TERM GOAL #3   Title sit with Rt=Lt weightbearing at least 50% of the time when sitting at work  Notices discomfort when sitting, but able to equally WB through > 50% of the time    Time 4   Period Weeks   Status Achieved           PT Long Term Goals - 03/09/15 3536    PT LONG TERM GOAL #3   Title report a 50% reduction in Lt LE radiculopathy  Does not feel pain/sharpness down Lt LE   Time 8   Period Weeks   Status Achieved   PT LONG TERM GOAL #4   Title return to cycling for exercise without limitation  Unable to assess as patient has not tried cycling recently   Time 8   Status On-going               Plan - 03/09/15 1138  Clinical Impression Statement Pain has centralized to low back/Lt buttock; further centralizes to low back with extension stretch. Pt tolerated increase in lumbar strengthening activities well. PA mobs revealed normal extension mobility.  Will continue benefit from skilled PT to progress strengthening exercises in order to return to daily activity and exercise program.    Pt will benefit from skilled therapeutic intervention in order to improve on the following deficits Improper body mechanics;Impaired flexibility;Decreased activity tolerance;Pain;Decreased strength   PT Frequency 2x / week   PT Duration 8 weeks   PT Treatment/Interventions ADLs/Self Care Home Management;Moist Heat;Therapeutic activities;Patient/family education;Therapeutic exercise;Traction;Passive range of motion;Manual techniques;Cryotherapy;Neuromuscular re-education;Electrical Stimulation   PT Next Visit Plan Progress prone exercises, perform bridging and modified  side plank for glute strengthening    Consulted and Agree with Plan of Care Patient        Problem List There are no active problems to display for this patient.   Reginal Lutes, SPT 03/09/2015 12:52 PM  [facility] [address] [place], [place], [address] Phone: [phone]   Fax:  [phone]

## 2015-03-11 ENCOUNTER — Encounter: Payer: Self-pay | Admitting: Physical Therapy

## 2015-03-11 ENCOUNTER — Ambulatory Visit: Payer: BLUE CROSS/BLUE SHIELD | Admitting: Physical Therapy

## 2015-03-11 DIAGNOSIS — M5442 Lumbago with sciatica, left side: Secondary | ICD-10-CM

## 2015-03-11 DIAGNOSIS — M25659 Stiffness of unspecified hip, not elsewhere classified: Secondary | ICD-10-CM

## 2015-03-11 NOTE — Patient Instructions (Signed)
Lower abdominal/core stability exercises  1. Practice your breathing technique: Inhale through your nose expanding your belly and rib cage. Try not to breathe into your chest. Exhale slowly and gradually out your mouth feeling a sense of softness to your body. Practice multiple times. This can be performed unlimited.  2. Finding the lower abdominals. Laying on your back with the knees bent, place your fingers just below your belly button. Using your breathing technique from above, on your exhale gently pull the belly button away from your fingertips without tensing any other muscles. Practice this 5x. Next, as you exhale, draw belly button inwards and hold onto it...then feel as if you are pulling that muscle across your pelvis like you are tightening a belt. This can be hard to do at first so be patient and practice. Do 5-10 reps 1-3 x day. Always recognize quality over quantity; if your abdominal muscles become tired you will notice you may tighten/contract other muscles. This is the time to take a break.   Practice this first laying on your back, then in sitting, progressing to standing and finally adding it to all your daily movements.   3. Finding your pelvic floor. Using the breathing technique above, when your exhale, this time draw your pelvic floor muscles up as if you were attempting to stop the flow of urination. Be careful NOT to tense any other muscles. This can be hard, BE PATIENT. Try to hold up to 10 seconds repeating 10x. Try 2x a day. Once you feel you are doing this well, add this contraction to exercise #2. First contracting your pelvic floor followed by lower abdominals.  4. Adding leg movements. Add the following leg movements to challenge your ability to keep your core stable:  1. Single leg drop outs: Laying on your back with knees bent feet flat. Inhale,  dropping one knee outward KEEPING YOUR PELVIS STILL. Exhale as you bring the leg back, simultaneously performing your lower  abdominal contraction. Do 5-10 on each leg.  2. Marching: While keeping your pelvis still, lift the right foot a few inches, put it down then lift left foot. This will mimic a march. Start slow to establish control. Once you have control you may speed it up. Do 10-20x. You MUST keep your lower abdominlas contracted while you march. Breathe naturally   3. Single leg slides: Inhale while you slowly slide one leg out keeping your pelvis still. Only slide your leg as far as you can keep your pelvis still. Exhale as you bring the leg back to the start, contracting the lower abdominals as you do that. Keep your upper body relaxed. Do 5-10 on each side.     Tilt head and shoulder to right side, rotate same side hip toward head. Repeat ____ times per set. Do ____ sets per session. Do ____ sessions per day.  http://orth.exer.us/244   Isometric Hold (Quadruped)   On hands and knees, slowly inhale, and then exhale. Pull navel toward spine and Hold for 5___ seconds. Continue to breathe in and out during hold. Rest for __4_ seconds. Repeat __5_ times. Do __1-3_ times a day.     On hands and knees find neutral spine. Tighten pelvic floor and abdominals and hold. Alternately lift arm to shoulder level. Repeat ___ times. Do ___ times a day.     On hands and knees find neutral spine. Tighten pelvic floor and abdominals and hold. Alternating legs, straighten and lift to hip level. Repeat ___ times. Do ___  times a day.   )   On hands and knees find neutral spine. Tighten abdominals and hold. Alternating, lift arm to shoulder level and opposite leg to hip level. Repeat 5___ times. Do _1-2__ times a day.   Copyright  VHI. All rights reserved.

## 2015-03-11 NOTE — Therapy (Signed)
[facility] [address] [place], [place], [address] Phone: [phone]   Fax:  [phone]  Physical Therapy Treatment  Patient Details  Name: Jacob Golden MRN: [id number] Date of Birth: 1946-07-14 Referring Provider:  Jonathon Jordan, MD  Encounter Date: 03/11/2015      PT End of Session - 03/11/15 0915    Visit Number 7   Date for PT Re-Evaluation 04/05/15   PT Start Time 0845   PT Stop Time 0925   PT Time Calculation (min) 40 min   Activity Tolerance Patient tolerated treatment well   Behavior During Therapy [facility] for tasks assessed/performed      Past Medical History  Diagnosis Date  . Cancer 2013    prostate cancer  . Hyperlipidemia     History reviewed. No pertinent past surgical history.  There were no vitals filed for this visit.  Visit Diagnosis:  Left-sided low back pain with left-sided sciatica  Hip stiffness, unspecified laterality      Subjective Assessment - 03/11/15 0851    Subjective About the same as Wednesday. Not much new to say today.   Currently in Pain? Yes   Pain Score 1    Pain Location Leg   Pain Orientation Left;Lateral   Pain Descriptors / Indicators Dull;Aching   Aggravating Factors  Sitting for long time then getting up.    Pain Relieving Factors Watching how I move                         [facility] Adult PT Treatment/Exercise - 03/11/15 0001    Lumbar Exercises: Stretches   Piriformis Stretch 20 seconds  Pt needed VC to relax during stretch   Piriformis Stretch Limitations Also knee to opposite chest stretch bil 3x 3 breaths   Lumbar Exercises: Aerobic   UBE (Upper Arm Bike) L1 3x3 sitting on green ball   Lumbar Exercises: Standing   Other Standing Lumbar Exercises Resisted walking 25# 10x with VC on TA and posture   Lumbar Exercises: Supine   Ab Set --  TA introduction 8x, adding to his existing HEP   Lumbar Exercises: Quadruped   Opposite Arm/Leg  Raise Right arm/Left leg;Left arm/Right leg;5 reps;2 seconds   Opposite Arm/Leg Raise Limitations VC on engaging TA more and not back                PT Education - 03/11/15 0912    Education provided Yes   Education Details HEP for quadruped, contracting TA, and osture review   Methods Explanation;Demonstration;Tactile cues;Verbal cues;Handout   Comprehension Verbalized understanding;Returned demonstration          PT Short Term Goals - 03/09/15 1093    PT SHORT TERM GOAL #3   Title sit with Rt=Lt weightbearing at least 50% of the time when sitting at work  Notices discomfort when sitting, but able to equally WB through > 50% of the time    Time 4   Period Weeks   Status Achieved           PT Long Term Goals - 03/09/15 2355    PT LONG TERM GOAL #3   Title report a 50% reduction in Lt LE radiculopathy  Does not feel pain/sharpness down Lt LE   Time 8   Period Weeks   Status Achieved   PT LONG TERM GOAL #4   Title return to cycling for exercise without limitation  Unable to assess as patient  has not tried cycling recently   Time 8   Status On-going               Plan - 03/11/15 0916    Clinical Impression Statement Itroduced concept of using TA muscle to stabilize with his core better during his exercises. He did well with this. Lower leg symtpoms were abolished at end of session.    Pt will benefit from skilled therapeutic intervention in order to improve on the following deficits Improper body mechanics;Impaired flexibility;Decreased activity tolerance;Pain;Decreased strength   Rehab Potential Good   PT Frequency 2x / week   PT Duration 8 weeks   PT Treatment/Interventions ADLs/Self Care Home Management;Moist Heat;Therapeutic activities;Patient/family education;Therapeutic exercise;Traction;Passive range of motion;Manual techniques;Cryotherapy;Neuromuscular re-education;Electrical Stimulation   PT Next Visit Plan Review TA contraction and continue with  core exercises: add bridge and other neutral spine exercises to RX.   Consulted and Agree with Plan of Care Patient        Problem List There are no active problems to display for this patient.   Deidra Spease, PTA 03/11/2015, 9:27 AM  [facility] [address] [place], [place], [address] Phone: [phone]   Fax:  [phone]

## 2015-03-16 ENCOUNTER — Encounter: Payer: Self-pay | Admitting: Physical Therapy

## 2015-03-16 ENCOUNTER — Ambulatory Visit: Payer: BLUE CROSS/BLUE SHIELD | Admitting: Physical Therapy

## 2015-03-16 DIAGNOSIS — M25659 Stiffness of unspecified hip, not elsewhere classified: Secondary | ICD-10-CM

## 2015-03-16 DIAGNOSIS — M5442 Lumbago with sciatica, left side: Secondary | ICD-10-CM | POA: Diagnosis not present

## 2015-03-16 NOTE — Therapy (Signed)
[facility] [address] [place], [place], [address] Phone: [phone]   Fax:  [phone]  Physical Therapy Treatment  Patient Details  Name: Jacob Golden MRN: [id number] Date of Birth: 1945/11/01 Referring Provider:  Jonathon Jordan, MD  Encounter Date: 03/16/2015      PT End of Session - 03/16/15 0856    Visit Number 8   Date for PT Re-Evaluation 04/05/15   PT Start Time 0846   PT Stop Time 0930   PT Time Calculation (min) 44 min   Activity Tolerance Patient tolerated treatment well   Behavior During Therapy [facility] for tasks assessed/performed      Past Medical History  Diagnosis Date  . Cancer 2013    prostate cancer  . Hyperlipidemia     History reviewed. No pertinent past surgical history.  There were no vitals filed for this visit.  Visit Diagnosis:  Left-sided low back pain with left-sided sciatica  Hip stiffness, unspecified laterality      Subjective Assessment - 03/16/15 0852    Subjective Slowly progressing per patients report   Currently in Pain? Yes   Pain Score 1    Pain Location Leg   Pain Orientation Left;Lateral   Pain Descriptors / Indicators Dull;Aching   Pain Type Chronic pain   Pain Frequency Constant            [facility] PT Assessment - 03/16/15 0001    Observation/Other Assessments   Focus on Therapeutic Outcomes (FOTO)  30%  CJ status                     [facility] Adult PT Treatment/Exercise - 03/16/15 0001    Lumbar Exercises: Stretches   Piriformis Stretch 20 seconds;Other (comment)  x 3 in sitting, advised to perform at home   Piriformis Stretch Limitations SKC diagonal to opposite shoulder 3x 20 sec, bil   Lumbar Exercises: Aerobic   Stationary Bike L2 x 8 min   Lumbar Exercises: Standing   Other Standing Lumbar Exercises Resisted walking 25# 10x with VC on TA and posture                  PT Short Term Goals - 03/09/15 2694    PT SHORT  TERM GOAL #3   Title sit with Rt=Lt weightbearing at least 50% of the time when sitting at work  Notices discomfort when sitting, but able to equally WB through > 50% of the time    Time 4   Period Weeks   Status Achieved           PT Long Term Goals - 03/16/15 8546    PT LONG TERM GOAL #1   Title be independent in advanced HEP   Time 8   Period Weeks   Status On-going   PT LONG TERM GOAL #2   Title reduce FOTO to < or 35% limitation  As of June 8th, scored 30% on FOTO = CJ status   Time 8   Period Weeks   Status Achieved   PT LONG TERM GOAL #3   Title report a 50% reduction in Lt LE radiculopathy  90% improvement   Time 8   Period Weeks   Status Achieved   PT LONG TERM GOAL #4   Title return to cycling for exercise without limitation   Time 8   Period Weeks   Status On-going   PT LONG TERM GOAL #5   Title sit with  Rt=Lt weightbearing at least 75% of the time at work   Time [place] - 03/16/15 0857    Clinical Impression Statement Pt improved in all areas and is ready for D/C at end of this week    Pt will benefit from skilled therapeutic intervention in order to improve on the following deficits Improper body mechanics;Impaired flexibility;Decreased activity tolerance;Pain;Decreased strength   Rehab Potential Good   PT Frequency 2x / week   PT Duration 8 weeks   PT Treatment/Interventions ADLs/Self Care Home Management;Moist Heat;Therapeutic activities;Patient/family education;Therapeutic exercise;Traction;Passive range of motion;Manual techniques;Cryotherapy;Neuromuscular re-education;Electrical Stimulation   PT Next Visit Plan Pt is ready for D/C on Friday   Consulted and Agree with Plan of Care Patient        Problem List There are no active problems to display for this patient.   NAUMANN-HOUEGNIFIO,Keiona Jenison PTA 03/16/2015, 9:31 AM  [facility] [address], [place], [place], [address] Phone: [phone]   Fax:  [phone]

## 2015-03-18 ENCOUNTER — Ambulatory Visit: Payer: BLUE CROSS/BLUE SHIELD | Admitting: Physical Therapy

## 2015-03-18 ENCOUNTER — Encounter: Payer: Self-pay | Admitting: Physical Therapy

## 2015-03-18 DIAGNOSIS — M25659 Stiffness of unspecified hip, not elsewhere classified: Secondary | ICD-10-CM

## 2015-03-18 DIAGNOSIS — M5442 Lumbago with sciatica, left side: Secondary | ICD-10-CM

## 2015-03-18 NOTE — Therapy (Signed)
[facility] [address] [place], [place], [address] Phone: [phone]   Fax:  [phone]  Physical Therapy Treatment  Patient Details  Name: Jacob Golden MRN: [id number] Date of Birth: [birth year] Referring Provider:  Jonathon Jordan, MD  Encounter Date: 03/18/2015      PT End of Session - 03/18/15 0918    Visit Number 9   Date for PT Re-Evaluation 04/05/15   PT Start Time 0845   PT Stop Time 0929   PT Time Calculation (min) 44 min   Activity Tolerance Patient tolerated treatment well   Behavior During Therapy [facility] for tasks assessed/performed      Past Medical History  Diagnosis Date  . Cancer 2013    prostate cancer  . Hyperlipidemia     History reviewed. No pertinent past surgical history.  There were no vitals filed for this visit.  Visit Diagnosis:  Hip stiffness, unspecified laterality  Left-sided low back pain with left-sided sciatica      Subjective Assessment - 03/18/15 0925    Subjective Pt rates his reduction in radiating pain in left side as 90%, however ROM bil hips is still limited   Pertinent History prostate cancer   Patient Stated Goals reduce pain, walk at normal speed, return to exercise  pt is planning to return to the gym next week   Currently in Pain? Yes   Pain Score 1    Pain Location Leg   Pain Orientation Left;Lateral   Pain Descriptors / Indicators Aching;Dull   Pain Type Chronic pain   Pain Frequency Constant   Aggravating Factors  getting up aafter prolonged sitting   Pain Relieving Factors Watching how I move   Multiple Pain Sites No            [facility] PT Assessment - 03/18/15 0001    Assessment   Medical Diagnosis Low back pain (M54.5), radiculopathy (M54.10)   Onset Date/Surgical Date 01/02/15   Next MD Visit none   Prior Therapy none   Precautions   Precautions Other (comment)   Restrictions   Weight Bearing Restrictions No   Balance Screen   Has  the patient fallen in the past 6 months No   Has the patient had a decrease in activity level because of a fear of falling?  No   Is the patient reluctant to leave their home because of a fear of falling?  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   PROM   Overall PROM  Deficits   Overall PROM Comments Rt hip flexibility is limited by 40% in all directions, Lt hip flexibility limited by 30-40%  in all directions.     Strength   Overall Strength Within functional limits for tasks performed   Overall Strength Comments 4+/5 to 5/5 bilateral LE strength thrgoughout   Palpation   Palpation comment Pt with significant limitaiton in Rt and Lt hip joint mobility.  Reduced PA mobility in the lumbar and thoracic spine.  No significant palpable tenderness in the lumbar and thoracic paraspinals.     Special Tests    Special Tests --   Ambulation/Gait   Ambulation/Gait Yes   Ambulation/Gait Assistance 7: Independent   Gait velocity [facility]                     [facility] Adult PT Treatment/Exercise - 03/18/15 0001    Lumbar Exercises: Stretches   Piriformis Stretch 20 seconds;Other (comment)  in sitting and supine each x 3  with 20seconds   Piriformis Stretch Limitations SKC diagonal to opposite shoulder 3x 20 sec, bil   Lumbar Exercises: Aerobic   UBE (Upper Arm Bike) L1 3x3 sitting on green ball   Lumbar Exercises: Machines for Strengthening   Other Lumbar Machine Exercise Backwards walking; 25# with two handholds at machine, 1x10   Lumbar Exercises: Standing   Other Standing Lumbar Exercises Resisted walking 25# 10x with VC on TA and posture                  PT Short Term Goals - 03/09/15 0938    PT SHORT TERM GOAL #3   Title sit with Rt=Lt weightbearing at least 50% of the time when sitting at work  Notices discomfort when sitting, but able to equally WB through > 50% of the time    Time 4   Period Weeks   Status Achieved           PT Long Term Goals - 03/18/15 1829    PT LONG TERM GOAL #1   Title be independent in advanced HEP   Time 8   Period Weeks   Status Achieved   PT LONG TERM GOAL #2   Title reduce FOTO to < or 35% limitation   Time 8   Period Weeks   Status Achieved   PT LONG TERM GOAL #3   Title report a 50% reduction in Lt LE radiculopathy  as of 03/18/15 reports 90% reduction, even in the am much better   Time 8   Period Weeks   Status Achieved   PT LONG TERM GOAL #4   Title return to cycling for exercise without limitation   Time 8   Period Weeks   Status Achieved   PT LONG TERM GOAL #5   Title sit with Rt=Lt weightbearing at least 75% of the time at work   Time 8   Period Weeks   Status Achieved               Plan - 03/18/15 0919    Clinical Impression Statement Pt with equal weightbearing bil LE with ambulation, his Rt hip limited in ROM compare to Lt hip, Pt able to tolerate activities in PT very well and achieved all LTG's   Pt will benefit from skilled therapeutic intervention in order to improve on the following deficits Improper body mechanics;Impaired flexibility;Decreased activity tolerance;Pain;Decreased strength   Rehab Potential Good    PT Frequency 2x / week   PT Duration 8 weeks   PT Treatment/Interventions ADLs/Self Care Home Management;Moist Heat;Therapeutic activities;Patient/family education;Therapeutic exercise;Traction;Passive range of motion;Manual techniques;Cryotherapy;Neuromuscular re-education;Electrical Stimulation  PT Next Visit Plan D/C to HEP   Consulted and Agree with Plan of Care Patient          G-Codes - 14-Apr-2015 1225    Functional Assessment Tool Used FOTO      Problem List There are no active problems to display for this patient.   [place] 2015/04/14, 12:36 PM  [facility] [address], [place], [place], [address] Phone: [phone]   Fax:  [phone]  PHYSICAL THERAPY DISCHARGE SUMMARY  Visits from Start of Care: 9  Current functional level related to goals / functional outcomes: All goals met as noted above.   Remaining deficits: Pt. does still have limited hip ROM, right limited more than left.   Education / Equipment: HEP  Plan: Patient agrees to discharge.  Patient goals were met. Patient is being discharged due to meeting the stated rehab goals.  ?????       Serafina Royals, PT 14-Apr-2015 12:36 PM

## 2015-03-18 NOTE — Therapy (Signed)
[facility] [address] [place], [place], [address] Phone: [phone]   Fax:  [phone]  Physical Therapy Treatment  Patient Details  Name: Jacob Golden MRN: [id number] Date of Birth: [birth year] Referring Provider:  Jonathon Jordan, MD  Encounter Date: 03/18/2015      PT End of Session - 03/18/15 0918    Visit Number 9   Date for PT Re-Evaluation 04/05/15   PT Start Time 0845   PT Stop Time 0929   PT Time Calculation (min) 44 min   Activity Tolerance Patient tolerated treatment well   Behavior During Therapy [facility] for tasks assessed/performed      Past Medical History  Diagnosis Date  . Cancer 2013    prostate cancer  . Hyperlipidemia     History reviewed. No pertinent past surgical history.  There were no vitals filed for this visit.  Visit Diagnosis:  Hip stiffness, unspecified laterality  Left-sided low back pain with left-sided sciatica      Subjective Assessment - 03/18/15 0925    Subjective Pt rates his reduction in radiating pain in left side as 90%, however ROM bil hips is still limited   Pertinent History prostate cancer   Patient Stated Goals reduce pain, walk at normal speed, return to exercise  pt is planning to return to the gym next week   Currently in Pain? Yes   Pain Score 1    Pain Location Leg   Pain Orientation Left;Lateral   Pain Descriptors / Indicators Aching;Dull   Pain Type Chronic pain   Pain Frequency Constant   Aggravating Factors  getting up aafter prolonged sitting   Pain Relieving Factors Watching how I move   Multiple Pain Sites No            [facility] PT Assessment - 03/18/15 0001    Assessment   Medical Diagnosis Low back pain (M54.5), radiculopathy (M54.10)   Onset Date/Surgical Date 01/02/15   Next MD Visit none   Prior Therapy none   Precautions   Precautions Other (comment)   Restrictions   Weight Bearing Restrictions No   Balance Screen   Has  the patient fallen in the past 6 months No   Has the patient had a decrease in activity level because of a fear of falling?  No   Is the patient reluctant to leave their home because of a fear of falling?  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   PROM   Overall PROM  Deficits   Overall PROM Comments Rt hip flexibility is limited by 40% in all directions, Lt hip flexibility limited by 30-40%  in all directions.     Strength   Overall Strength Within functional limits for tasks performed   Overall Strength Comments 4+/5 to 5/5 bilateral LE strength thrgoughout   Palpation   Palpation comment Pt with significant limitaiton in Rt and Lt hip joint mobility.  Reduced PA mobility in the lumbar and thoracic spine.  No significant palpable tenderness in the lumbar and thoracic paraspinals.     Special Tests    Special Tests --   Ambulation/Gait   Ambulation/Gait Yes   Ambulation/Gait Assistance 7: Independent   Gait velocity [facility]                     [facility] Adult PT Treatment/Exercise - 03/18/15 0001    Lumbar Exercises: Stretches   Piriformis Stretch 20 seconds;Other (comment)  in sitting and supine each x 3  with 20seconds   Piriformis Stretch Limitations SKC diagonal to opposite shoulder 3x 20 sec, bil   Lumbar Exercises: Aerobic   UBE (Upper Arm Bike) L1 3x3 sitting on green ball   Lumbar Exercises: Machines for Strengthening   Other Lumbar Machine Exercise Backwards walking; 25# with two handholds at machine, 1x10   Lumbar Exercises: Standing   Other Standing Lumbar Exercises Resisted walking 25# 10x with VC on TA and posture                  PT Short Term Goals - 03/09/15 0938    PT SHORT TERM GOAL #3   Title sit with Rt=Lt weightbearing at least 50% of the time when sitting at work  Notices discomfort when sitting, but able to equally WB through > 50% of the time    Time 4   Period Weeks   Status Achieved           PT Long Term Goals - 03/18/15 1829    PT LONG TERM GOAL #1   Title be independent in advanced HEP   Time 8   Period Weeks   Status Achieved   PT LONG TERM GOAL #2   Title reduce FOTO to < or 35% limitation   Time 8   Period Weeks   Status Achieved   PT LONG TERM GOAL #3   Title report a 50% reduction in Lt LE radiculopathy  as of 03/18/15 reports 90% reduction, even in the am much better   Time 8   Period Weeks   Status Achieved   PT LONG TERM GOAL #4   Title return to cycling for exercise without limitation   Time 8   Period Weeks   Status Achieved   PT LONG TERM GOAL #5   Title sit with Rt=Lt weightbearing at least 75% of the time at work   Time 8   Period Weeks   Status Achieved               Plan - 03/18/15 0919    Clinical Impression Statement Pt with equal weightbearing bil LE with ambulation, his Rt hip limited in ROM compare to Lt hip, Pt able to tolerate activities in PT very well and achieved all LTG's   Pt will benefit from skilled therapeutic intervention in order to improve on the following deficits Improper body mechanics;Impaired flexibility;Decreased activity tolerance;Pain;Decreased strength   Rehab Potential Good    PT Frequency 2x / week   PT Duration 8 weeks   PT Treatment/Interventions ADLs/Self Care Home Management;Moist Heat;Therapeutic activities;Patient/family education;Therapeutic exercise;Traction;Passive range of motion;Manual techniques;Cryotherapy;Neuromuscular re-education;Electrical Stimulation  PT Next Visit Plan D/C to HEP   Consulted and Agree with Plan of Care Patient        Problem List There are no active problems to display for this patient.   NAUMANN-HOUEGNIFIO,Alianis Trimmer 03/18/2015, 9:30 AM  [facility] [address] [place], [place], [address] Phone: [phone]   Fax:  [phone]

## 2016-08-05 IMAGING — CR DG LUMBAR SPINE COMPLETE 4+V
5 series · 5 of 5 positions shown · non-contrast
Comparison: 10/25/2006 lumbar spine MR.

CLINICAL DATA: 68-year-old male low back pain left leg pain for the
past 3-4 weeks after bending the lift object. History of prostate
cancer. Initial encounter.

EXAM:
LUMBAR SPINE - COMPLETE 4+ VIEW

[t l-spine a.p.]
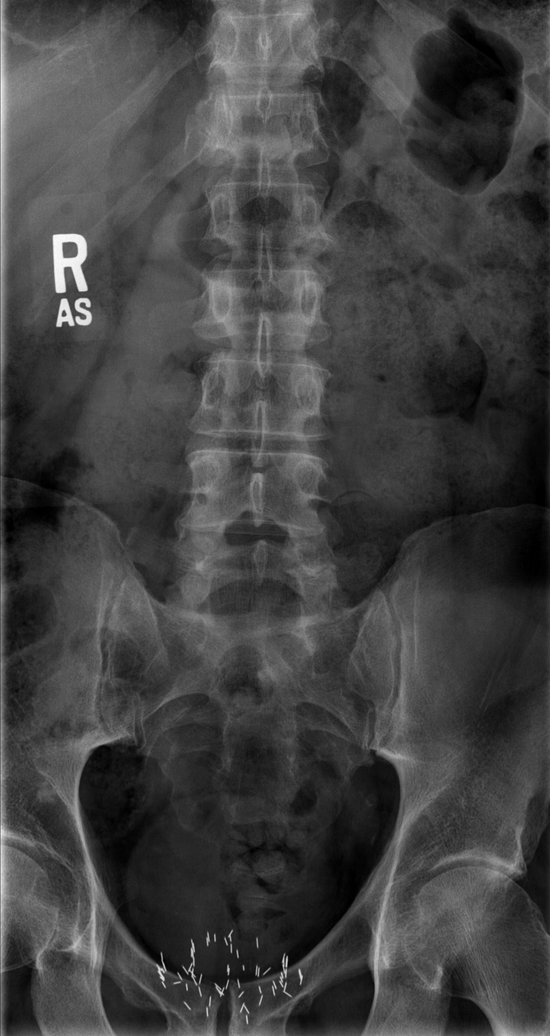

[t l-spine oblique exposure (1 of 2)]
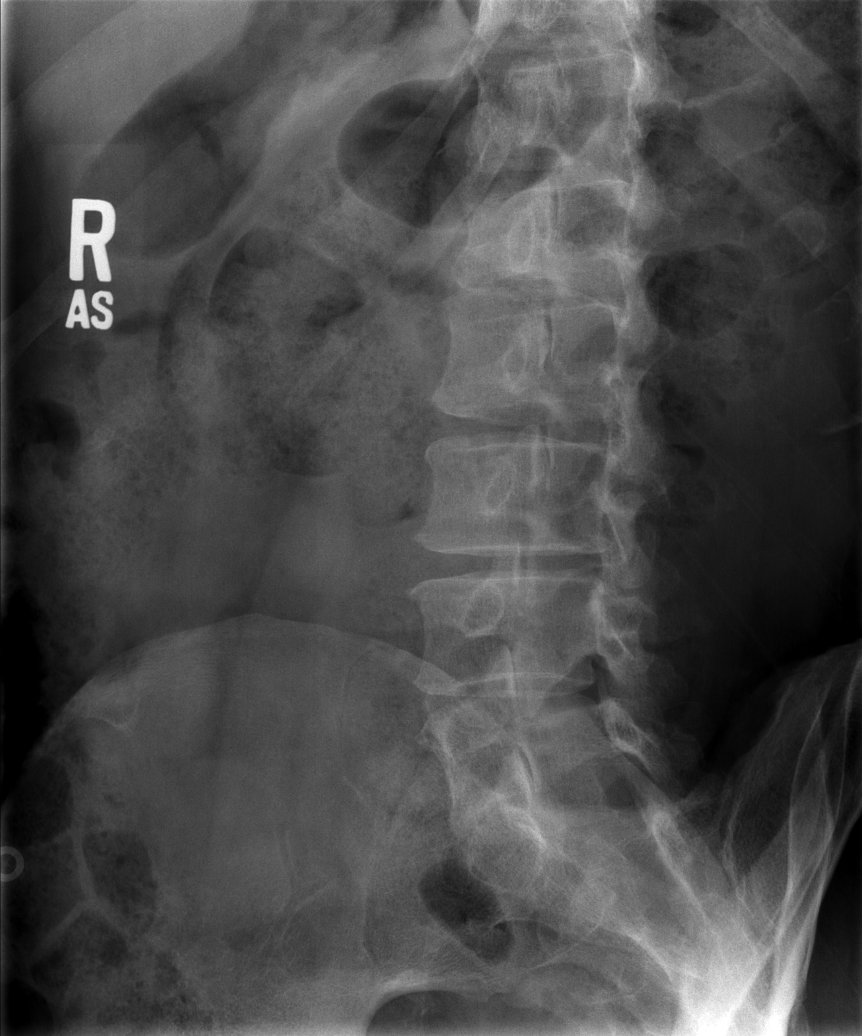

[t l-spine oblique exposure (2 of 2)]
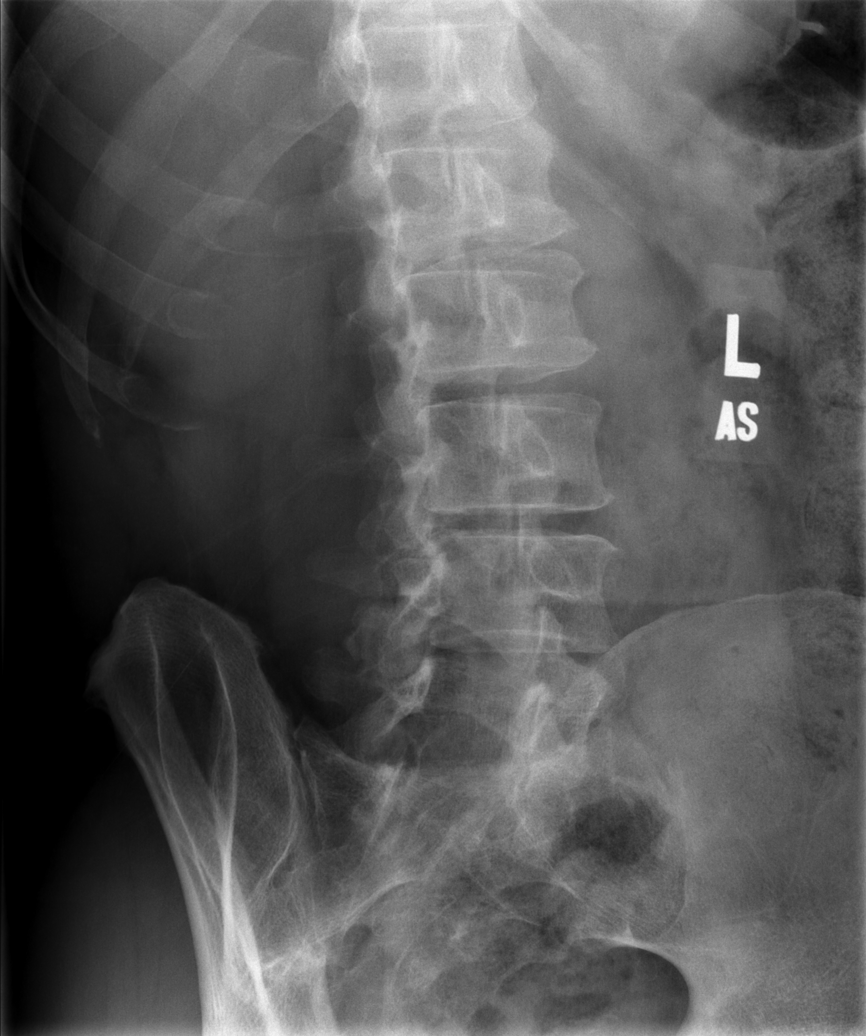

[t l-spine lat]
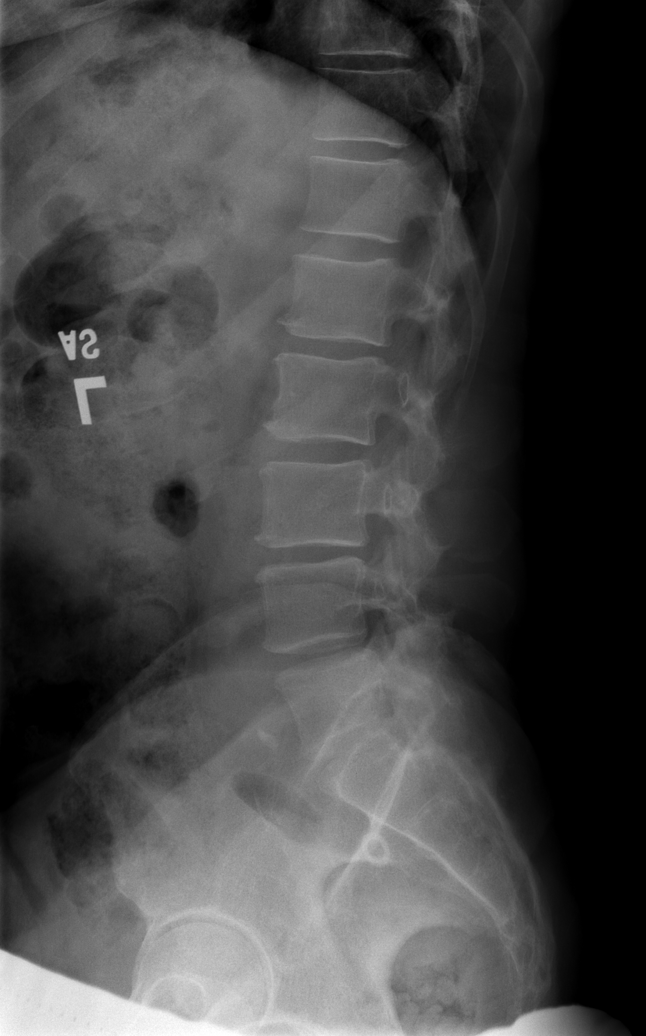

[t l-spine l5-s1 spot]
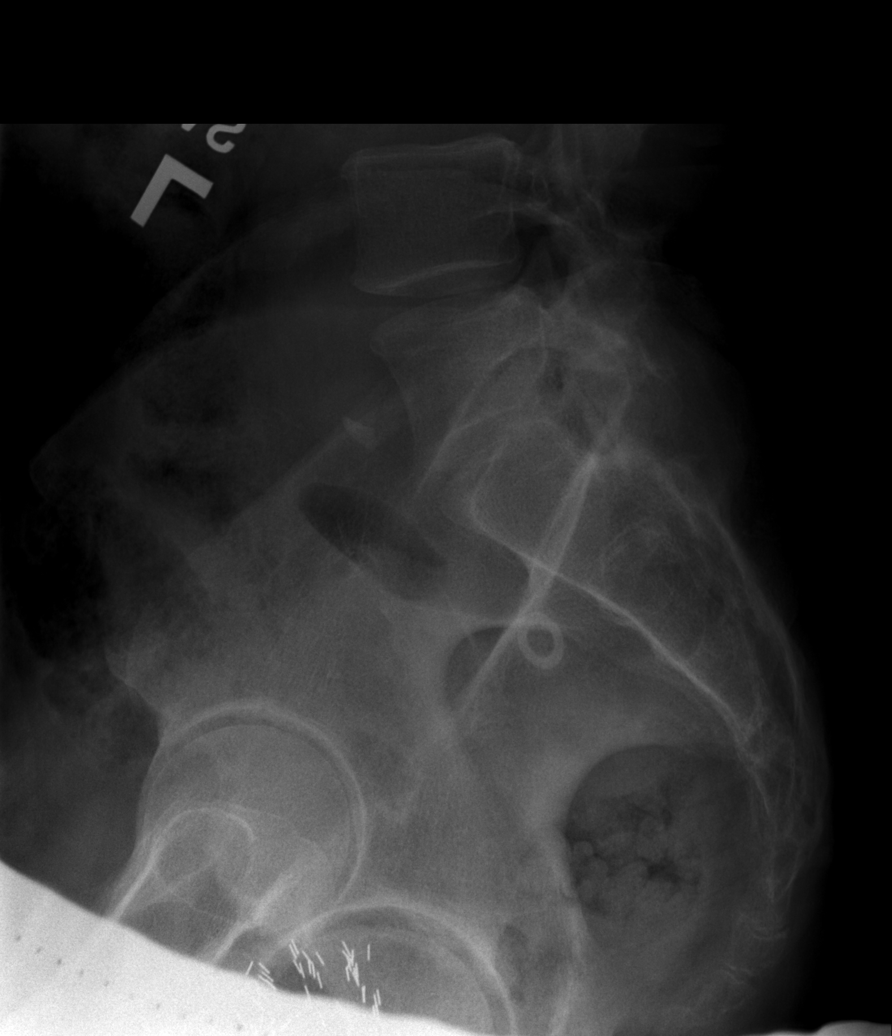

[5 of 5 positions shown; findings below may reference images not displayed]

FINDINGS: Mild disc space narrowing L3-4 with minimal disc space narrowing
L4-5 and L5-S1.

Normal alignment.

No pars defect.

Mild left hip joint degenerative changes.

Prostatic seed implants in place without plain film evidence of
osseous sclerotic metastatic disease.
IMPRESSION: Mild disc space narrowing L3-4 with minimal disc space narrowing
L4-5 and L5-S1.

Mild left hip joint degenerative changes.

Please see above.

## 2019-11-10 ENCOUNTER — Ambulatory Visit: Payer: BLUE CROSS/BLUE SHIELD

## 2019-11-15 ENCOUNTER — Ambulatory Visit: Payer: Medicare Other | Attending: Internal Medicine

## 2019-11-15 DIAGNOSIS — Z23 Encounter for immunization: Secondary | ICD-10-CM | POA: Insufficient documentation

## 2019-11-15 NOTE — Progress Notes (Signed)
   Covid-19 Vaccination Clinic  Name:  Jacob Golden    MRN: [id number] DOB: [birth year]  11/15/2019  Mr. Clowdus was observed post Covid-19 immunization for 15 minutes without incidence. He was provided with Vaccine Information Sheet and instruction to access the V-Safe system.   Mr. Vienneau was instructed to call 911 with any severe reactions post vaccine: [name] Kitchen Difficulty breathing  . Swelling of your face and throat  . A fast heartbeat  . A bad rash all over your body  . Dizziness and weakness    Immunizations Administered    Name Date Dose VIS Date Route   Pfizer COVID-19 Vaccine 11/15/2019  2:24 PM 0.3 mL 09/18/2019 Intramuscular   Manufacturer: [place]   Lot: [id number]   [place]: [id number]

## 2019-12-10 ENCOUNTER — Ambulatory Visit: Payer: Medicare Other | Attending: Internal Medicine

## 2019-12-10 DIAGNOSIS — Z23 Encounter for immunization: Secondary | ICD-10-CM | POA: Insufficient documentation

## 2019-12-10 NOTE — Progress Notes (Signed)
   Covid-19 Vaccination Clinic  Name:  Jacob Golden    MRN: [id number] DOB: 30-Dec-1945  12/10/2019  Mr. [name] was observed post Covid-19 immunization for 15 minutes without incident. He was provided with Vaccine Information Sheet and instruction to access the V-Safe system.   Mr. [name] was instructed to call 911 with any severe reactions post vaccine: [name] Kitchen Difficulty breathing  . Swelling of face and throat  . A fast heartbeat  . A bad rash all over body  . Dizziness and weakness   Immunizations Administered    Name Date Dose VIS Date Route   Pfizer COVID-19 Vaccine 12/10/2019  2:39 PM 0.3 mL 09/18/2019 Intramuscular   Manufacturer: [place]   Lot: [id number]   [place]: [id number]

## 2020-10-17 DIAGNOSIS — Z1152 Encounter for screening for COVID-19: Secondary | ICD-10-CM | POA: Diagnosis not present

## 2020-11-14 ENCOUNTER — Other Ambulatory Visit: Payer: Self-pay | Admitting: Neurology

## 2020-11-14 ENCOUNTER — Other Ambulatory Visit (HOSPITAL_COMMUNITY): Payer: Self-pay | Admitting: Family Medicine

## 2020-11-14 ENCOUNTER — Other Ambulatory Visit: Payer: Self-pay | Admitting: Family Medicine

## 2020-11-14 ENCOUNTER — Other Ambulatory Visit (HOSPITAL_COMMUNITY): Payer: Self-pay | Admitting: Neurology

## 2020-11-14 DIAGNOSIS — R109 Unspecified abdominal pain: Secondary | ICD-10-CM | POA: Diagnosis not present

## 2020-11-14 DIAGNOSIS — N3000 Acute cystitis without hematuria: Secondary | ICD-10-CM | POA: Diagnosis not present

## 2020-11-15 ENCOUNTER — Other Ambulatory Visit: Payer: Self-pay

## 2020-11-15 ENCOUNTER — Encounter (HOSPITAL_COMMUNITY): Payer: Self-pay

## 2020-11-15 ENCOUNTER — Ambulatory Visit (HOSPITAL_COMMUNITY)
Admission: RE | Admit: 2020-11-15 | Discharge: 2020-11-15 | Disposition: A | Discharge status: Home / Self Care | Payer: Medicare Other | Source: Ambulatory Visit | Attending: Family Medicine | Admitting: Family Medicine

## 2020-11-15 DIAGNOSIS — R109 Unspecified abdominal pain: Secondary | ICD-10-CM | POA: Diagnosis not present

## 2020-11-15 LAB — POCT I-STAT CREATININE: Creatinine, Ser: 1 mg/dL (ref 0.61–1.24)

## 2020-11-15 MED ORDER — IOHEXOL 300 MG/ML  SOLN
100.0000 mL | Freq: Once | INTRAMUSCULAR | Status: AC | PRN
  Administered 2020-11-15: 100 mL via INTRAVENOUS

## 2020-11-15 MED ORDER — IOHEXOL 9 MG/ML PO SOLN
500.0000 mL | ORAL | Status: AC
  Administered 2020-11-15 (×2): 500 mL via ORAL

## 2020-11-15 MED ORDER — IOHEXOL 9 MG/ML PO SOLN
ORAL | Status: AC
  Filled 2020-11-15: qty 1000

## 2020-12-07 DIAGNOSIS — R3915 Urgency of urination: Secondary | ICD-10-CM | POA: Diagnosis not present

## 2020-12-07 DIAGNOSIS — N281 Cyst of kidney, acquired: Secondary | ICD-10-CM | POA: Diagnosis not present

## 2020-12-15 ENCOUNTER — Other Ambulatory Visit: Payer: Self-pay | Admitting: Urology

## 2020-12-15 ENCOUNTER — Other Ambulatory Visit (HOSPITAL_COMMUNITY): Payer: Self-pay | Admitting: Urology

## 2020-12-15 DIAGNOSIS — N281 Cyst of kidney, acquired: Secondary | ICD-10-CM

## 2020-12-15 DIAGNOSIS — R93422 Abnormal radiologic findings on diagnostic imaging of left kidney: Secondary | ICD-10-CM

## 2020-12-26 ENCOUNTER — Ambulatory Visit (HOSPITAL_COMMUNITY)
Admission: RE | Admit: 2020-12-26 | Discharge: 2020-12-26 | Disposition: A | Discharge status: Home / Self Care | Payer: Medicare Other | Source: Ambulatory Visit | Attending: Urology | Admitting: Urology

## 2020-12-26 ENCOUNTER — Other Ambulatory Visit: Payer: Self-pay

## 2020-12-26 DIAGNOSIS — N281 Cyst of kidney, acquired: Secondary | ICD-10-CM | POA: Insufficient documentation

## 2020-12-26 DIAGNOSIS — R93422 Abnormal radiologic findings on diagnostic imaging of left kidney: Secondary | ICD-10-CM | POA: Insufficient documentation

## 2020-12-26 DIAGNOSIS — N2889 Other specified disorders of kidney and ureter: Secondary | ICD-10-CM | POA: Diagnosis not present

## 2020-12-26 MED ORDER — GADOBUTROL 1 MMOL/ML IV SOLN
8.0000 mL | Freq: Once | INTRAVENOUS | Status: AC | PRN
  Administered 2020-12-26: 8 mL via INTRAVENOUS

## 2021-01-26 DIAGNOSIS — D49512 Neoplasm of unspecified behavior of left kidney: Secondary | ICD-10-CM | POA: Diagnosis not present

## 2021-05-30 ENCOUNTER — Other Ambulatory Visit: Payer: Self-pay | Admitting: Urology

## 2021-05-30 DIAGNOSIS — D49512 Neoplasm of unspecified behavior of left kidney: Secondary | ICD-10-CM

## 2021-06-22 ENCOUNTER — Other Ambulatory Visit: Payer: Self-pay

## 2021-06-22 ENCOUNTER — Ambulatory Visit
Admission: RE | Admit: 2021-06-22 | Discharge: 2021-06-22 | Disposition: A | Discharge status: Home / Self Care | Payer: Medicare Other | Source: Ambulatory Visit | Attending: Urology | Admitting: Urology

## 2021-06-22 DIAGNOSIS — N281 Cyst of kidney, acquired: Secondary | ICD-10-CM | POA: Diagnosis not present

## 2021-06-22 DIAGNOSIS — N2889 Other specified disorders of kidney and ureter: Secondary | ICD-10-CM | POA: Diagnosis not present

## 2021-06-22 DIAGNOSIS — D49512 Neoplasm of unspecified behavior of left kidney: Secondary | ICD-10-CM

## 2021-06-22 MED ORDER — GADOBENATE DIMEGLUMINE 529 MG/ML IV SOLN
17.0000 mL | Freq: Once | INTRAVENOUS | Status: AC | PRN
  Administered 2021-06-22: 17 mL via INTRAVENOUS

## 2021-06-29 DIAGNOSIS — Z23 Encounter for immunization: Secondary | ICD-10-CM | POA: Diagnosis not present

## 2021-07-21 DIAGNOSIS — D49512 Neoplasm of unspecified behavior of left kidney: Secondary | ICD-10-CM | POA: Diagnosis not present

## 2021-07-28 DIAGNOSIS — N281 Cyst of kidney, acquired: Secondary | ICD-10-CM | POA: Diagnosis not present

## 2021-08-14 DIAGNOSIS — E039 Hypothyroidism, unspecified: Secondary | ICD-10-CM | POA: Diagnosis not present

## 2021-08-14 DIAGNOSIS — Z79899 Other long term (current) drug therapy: Secondary | ICD-10-CM | POA: Diagnosis not present

## 2021-08-14 DIAGNOSIS — E7800 Pure hypercholesterolemia, unspecified: Secondary | ICD-10-CM | POA: Diagnosis not present

## 2021-08-14 DIAGNOSIS — R7303 Prediabetes: Secondary | ICD-10-CM | POA: Diagnosis not present

## 2021-08-16 DIAGNOSIS — M1990 Unspecified osteoarthritis, unspecified site: Secondary | ICD-10-CM | POA: Diagnosis not present

## 2021-08-16 DIAGNOSIS — D49519 Neoplasm of unspecified behavior of unspecified kidney: Secondary | ICD-10-CM | POA: Diagnosis not present

## 2021-08-16 DIAGNOSIS — E7800 Pure hypercholesterolemia, unspecified: Secondary | ICD-10-CM | POA: Diagnosis not present

## 2021-08-16 DIAGNOSIS — R7303 Prediabetes: Secondary | ICD-10-CM | POA: Diagnosis not present

## 2021-08-16 DIAGNOSIS — E039 Hypothyroidism, unspecified: Secondary | ICD-10-CM | POA: Diagnosis not present

## 2021-08-16 DIAGNOSIS — M519 Unspecified thoracic, thoracolumbar and lumbosacral intervertebral disc disorder: Secondary | ICD-10-CM | POA: Diagnosis not present

## 2021-08-16 DIAGNOSIS — Z0000 Encounter for general adult medical examination without abnormal findings: Secondary | ICD-10-CM | POA: Diagnosis not present

## 2022-01-02 ENCOUNTER — Other Ambulatory Visit: Payer: Self-pay | Admitting: Urology

## 2022-01-02 DIAGNOSIS — N281 Cyst of kidney, acquired: Secondary | ICD-10-CM

## 2022-01-16 ENCOUNTER — Ambulatory Visit
Admission: RE | Admit: 2022-01-16 | Discharge: 2022-01-16 | Disposition: A | Discharge status: Home / Self Care | Payer: Medicare Other | Source: Ambulatory Visit | Attending: Urology | Admitting: Urology

## 2022-01-16 DIAGNOSIS — N281 Cyst of kidney, acquired: Secondary | ICD-10-CM

## 2022-01-16 DIAGNOSIS — N2889 Other specified disorders of kidney and ureter: Secondary | ICD-10-CM | POA: Diagnosis not present

## 2022-01-16 MED ORDER — GADOBENATE DIMEGLUMINE 529 MG/ML IV SOLN
15.0000 mL | Freq: Once | INTRAVENOUS | Status: AC | PRN
  Administered 2022-01-16: 15 mL via INTRAVENOUS

## 2022-01-22 DIAGNOSIS — N281 Cyst of kidney, acquired: Secondary | ICD-10-CM | POA: Diagnosis not present

## 2022-03-08 DIAGNOSIS — M79661 Pain in right lower leg: Secondary | ICD-10-CM | POA: Diagnosis not present

## 2022-03-08 DIAGNOSIS — S50811A Abrasion of right forearm, initial encounter: Secondary | ICD-10-CM | POA: Diagnosis not present

## 2022-03-08 DIAGNOSIS — M238X1 Other internal derangements of right knee: Secondary | ICD-10-CM | POA: Diagnosis not present

## 2022-03-08 DIAGNOSIS — S8011XA Contusion of right lower leg, initial encounter: Secondary | ICD-10-CM | POA: Diagnosis not present

## 2022-03-20 DIAGNOSIS — M79651 Pain in right thigh: Secondary | ICD-10-CM | POA: Diagnosis not present

## 2022-03-20 DIAGNOSIS — M25661 Stiffness of right knee, not elsewhere classified: Secondary | ICD-10-CM | POA: Diagnosis not present

## 2022-03-29 DIAGNOSIS — S76191A Other specified injury of right quadriceps muscle, fascia and tendon, initial encounter: Secondary | ICD-10-CM | POA: Diagnosis not present

## 2022-03-29 DIAGNOSIS — M79651 Pain in right thigh: Secondary | ICD-10-CM | POA: Diagnosis not present

## 2022-03-29 DIAGNOSIS — M25561 Pain in right knee: Secondary | ICD-10-CM | POA: Diagnosis not present

## 2022-03-30 DIAGNOSIS — M25561 Pain in right knee: Secondary | ICD-10-CM | POA: Diagnosis not present

## 2022-04-19 DIAGNOSIS — M25561 Pain in right knee: Secondary | ICD-10-CM | POA: Diagnosis not present

## 2022-04-23 DIAGNOSIS — M25561 Pain in right knee: Secondary | ICD-10-CM | POA: Diagnosis not present

## 2022-05-03 DIAGNOSIS — M25661 Stiffness of right knee, not elsewhere classified: Secondary | ICD-10-CM | POA: Diagnosis not present

## 2022-05-03 DIAGNOSIS — M79651 Pain in right thigh: Secondary | ICD-10-CM | POA: Diagnosis not present

## 2022-05-03 DIAGNOSIS — M25561 Pain in right knee: Secondary | ICD-10-CM | POA: Diagnosis not present

## 2022-08-29 DIAGNOSIS — D49519 Neoplasm of unspecified behavior of unspecified kidney: Secondary | ICD-10-CM | POA: Diagnosis not present

## 2022-08-29 DIAGNOSIS — Z79899 Other long term (current) drug therapy: Secondary | ICD-10-CM | POA: Diagnosis not present

## 2022-08-29 DIAGNOSIS — M1990 Unspecified osteoarthritis, unspecified site: Secondary | ICD-10-CM | POA: Diagnosis not present

## 2022-08-29 DIAGNOSIS — I700 Atherosclerosis of aorta: Secondary | ICD-10-CM | POA: Diagnosis not present

## 2022-08-29 DIAGNOSIS — E039 Hypothyroidism, unspecified: Secondary | ICD-10-CM | POA: Diagnosis not present

## 2022-08-29 DIAGNOSIS — R7303 Prediabetes: Secondary | ICD-10-CM | POA: Diagnosis not present

## 2022-08-29 DIAGNOSIS — E7800 Pure hypercholesterolemia, unspecified: Secondary | ICD-10-CM | POA: Diagnosis not present

## 2022-08-29 DIAGNOSIS — M519 Unspecified thoracic, thoracolumbar and lumbosacral intervertebral disc disorder: Secondary | ICD-10-CM | POA: Diagnosis not present

## 2022-08-29 DIAGNOSIS — I10 Essential (primary) hypertension: Secondary | ICD-10-CM | POA: Diagnosis not present

## 2022-08-29 DIAGNOSIS — Z0000 Encounter for general adult medical examination without abnormal findings: Secondary | ICD-10-CM | POA: Diagnosis not present

## 2022-11-27 ENCOUNTER — Other Ambulatory Visit: Payer: Self-pay | Admitting: Urology

## 2022-11-27 DIAGNOSIS — N281 Cyst of kidney, acquired: Secondary | ICD-10-CM

## 2022-12-19 ENCOUNTER — Ambulatory Visit
Admission: RE | Admit: 2022-12-19 | Discharge: 2022-12-19 | Disposition: A | Discharge status: Home / Self Care | Payer: Medicare Other | Source: Ambulatory Visit | Attending: Urology | Admitting: Urology

## 2022-12-19 DIAGNOSIS — N281 Cyst of kidney, acquired: Secondary | ICD-10-CM

## 2022-12-19 MED ORDER — GADOPICLENOL 0.5 MMOL/ML IV SOLN
7.5000 mL | Freq: Once | INTRAVENOUS | Status: AC | PRN
  Administered 2022-12-19: 7.5 mL via INTRAVENOUS

## 2023-01-17 DIAGNOSIS — N281 Cyst of kidney, acquired: Secondary | ICD-10-CM | POA: Diagnosis not present

## 2023-02-26 DIAGNOSIS — E039 Hypothyroidism, unspecified: Secondary | ICD-10-CM | POA: Diagnosis not present

## 2023-02-26 DIAGNOSIS — D49519 Neoplasm of unspecified behavior of unspecified kidney: Secondary | ICD-10-CM | POA: Diagnosis not present

## 2023-02-26 DIAGNOSIS — I700 Atherosclerosis of aorta: Secondary | ICD-10-CM | POA: Diagnosis not present

## 2023-06-24 DIAGNOSIS — H2513 Age-related nuclear cataract, bilateral: Secondary | ICD-10-CM | POA: Diagnosis not present

## 2023-06-24 DIAGNOSIS — H5203 Hypermetropia, bilateral: Secondary | ICD-10-CM | POA: Diagnosis not present

## 2023-06-24 DIAGNOSIS — Z135 Encounter for screening for eye and ear disorders: Secondary | ICD-10-CM | POA: Diagnosis not present

## 2023-06-24 DIAGNOSIS — H52223 Regular astigmatism, bilateral: Secondary | ICD-10-CM | POA: Diagnosis not present

## 2023-06-24 DIAGNOSIS — H524 Presbyopia: Secondary | ICD-10-CM | POA: Diagnosis not present

## 2023-08-28 DIAGNOSIS — Z79899 Other long term (current) drug therapy: Secondary | ICD-10-CM | POA: Diagnosis not present

## 2023-08-28 DIAGNOSIS — Z0000 Encounter for general adult medical examination without abnormal findings: Secondary | ICD-10-CM | POA: Diagnosis not present

## 2023-08-28 DIAGNOSIS — R7303 Prediabetes: Secondary | ICD-10-CM | POA: Diagnosis not present

## 2023-08-28 DIAGNOSIS — I700 Atherosclerosis of aorta: Secondary | ICD-10-CM | POA: Diagnosis not present

## 2023-08-28 DIAGNOSIS — E039 Hypothyroidism, unspecified: Secondary | ICD-10-CM | POA: Diagnosis not present

## 2023-08-28 DIAGNOSIS — E7800 Pure hypercholesterolemia, unspecified: Secondary | ICD-10-CM | POA: Diagnosis not present

## 2023-11-19 ENCOUNTER — Other Ambulatory Visit: Payer: Self-pay | Admitting: Urology

## 2023-11-19 DIAGNOSIS — N281 Cyst of kidney, acquired: Secondary | ICD-10-CM

## 2023-12-26 ENCOUNTER — Ambulatory Visit
Admission: RE | Admit: 2023-12-26 | Discharge: 2023-12-26 | Disposition: A | Discharge status: Home / Self Care | Payer: Medicare Other | Source: Ambulatory Visit | Attending: Urology

## 2023-12-26 DIAGNOSIS — N281 Cyst of kidney, acquired: Secondary | ICD-10-CM | POA: Diagnosis not present

## 2023-12-26 MED ORDER — GADOPICLENOL 0.5 MMOL/ML IV SOLN
7.0000 mL | Freq: Once | INTRAVENOUS | Status: AC | PRN
  Administered 2023-12-26: 7 mL via INTRAVENOUS

## 2024-01-10 DIAGNOSIS — N281 Cyst of kidney, acquired: Secondary | ICD-10-CM | POA: Diagnosis not present
